# Patient Record
Sex: Female | Born: 2005 | Race: Black or African American | Hispanic: No | Marital: Single | Smoking: Never smoker
Health system: Southern US, Community
[De-identification: ages and names within clinical notes are randomized; demographics above are authoritative.]

## PROBLEM LIST (undated history)

## (undated) DIAGNOSIS — L309 Dermatitis, unspecified: Secondary | ICD-10-CM

## (undated) DIAGNOSIS — J45909 Unspecified asthma, uncomplicated: Secondary | ICD-10-CM

## (undated) DIAGNOSIS — T7840XA Allergy, unspecified, initial encounter: Secondary | ICD-10-CM

## (undated) HISTORY — PX: ADENOIDECTOMY: SUR15

## (undated) HISTORY — PX: APPENDECTOMY: SHX54

## (undated) HISTORY — PX: TONSILLECTOMY: SUR1361

## (undated) HISTORY — DX: Unspecified asthma, uncomplicated: J45.909

## (undated) HISTORY — DX: Allergy, unspecified, initial encounter: T78.40XA

---
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
## 2023-01-05 ENCOUNTER — Other Ambulatory Visit (HOSPITAL_COMMUNITY): Payer: Self-pay

## 2023-01-05 ENCOUNTER — Encounter: Payer: Self-pay | Admitting: Family Medicine

## 2023-01-05 ENCOUNTER — Ambulatory Visit (INDEPENDENT_AMBULATORY_CARE_PROVIDER_SITE_OTHER): Payer: 59 | Admitting: Family Medicine

## 2023-01-05 ENCOUNTER — Other Ambulatory Visit: Payer: Self-pay | Admitting: Family Medicine

## 2023-01-05 VITALS — BP 102/68 | HR 80 | Temp 98.6°F | Ht 64.5 in | Wt 156.6 lb

## 2023-01-05 DIAGNOSIS — E559 Vitamin D deficiency, unspecified: Secondary | ICD-10-CM

## 2023-01-05 DIAGNOSIS — J302 Other seasonal allergic rhinitis: Secondary | ICD-10-CM | POA: Diagnosis not present

## 2023-01-05 DIAGNOSIS — Z3041 Encounter for surveillance of contraceptive pills: Secondary | ICD-10-CM | POA: Diagnosis not present

## 2023-01-05 DIAGNOSIS — Z8709 Personal history of other diseases of the respiratory system: Secondary | ICD-10-CM | POA: Diagnosis not present

## 2023-01-05 DIAGNOSIS — Z8669 Personal history of other diseases of the nervous system and sense organs: Secondary | ICD-10-CM | POA: Diagnosis not present

## 2023-01-05 DIAGNOSIS — Z91018 Allergy to other foods: Secondary | ICD-10-CM

## 2023-01-05 DIAGNOSIS — Z7689 Persons encountering health services in other specified circumstances: Secondary | ICD-10-CM | POA: Diagnosis not present

## 2023-01-05 MED ORDER — LOW-OGESTREL 0.3-30 MG-MCG PO TABS
1.0000 | ORAL_TABLET | Freq: Every day | ORAL | 4 refills | Status: DC
  Filled 2023-01-05: qty 84, 84d supply, fill #0

## 2023-01-05 MED ORDER — EPINEPHRINE 0.3 MG/0.3ML IJ SOAJ
INTRAMUSCULAR | 2 refills | Status: DC
  Filled 2023-01-05: qty 4, 30d supply, fill #0

## 2023-01-05 MED ORDER — CETIRIZINE HCL 10 MG PO TABS
10.0000 mg | ORAL_TABLET | Freq: Every day | ORAL | 3 refills | Status: AC
  Filled 2023-01-05: qty 90, 90d supply, fill #0

## 2023-01-05 NOTE — Patient Instructions (Signed)
If you would like to have allergy testing done let us know so we can place a referral for you to see the allergist.  Typically at least a week or so prior to allergy testing he have to stop taking antihistamines such as Zyrtec.  A prescription was also sent to the pharmacy for a new EpiPen in case you need it for school.  If you do not need it right now you do not have to pick it up.

## 2023-01-05 NOTE — Progress Notes (Signed)
Established Patient Office Visit   Subjective  Patient ID: Heidi Jenkins, female    DOB: 2005/08/04  Age: 17 y.o. MRN: [id number]  Chief Complaint  Patient presents with   Establish Care    Med refills    Patient is an 17 year old female previously seen by Dr. Jeanelle Malling, pediatrics and seen for establish care and follow-up on chronic conditions.  Asthma: No recent flares.  In the past exercise-induced.  Seasonal allergies: Takes cetirizine as needed  Vitamin D deficiency: Taking OTC vitamin D 2000 IU use daily.  Last vitamin D check around January 2024 was right around 30.  History of migraines: No recent headaches.  Was taking a lot of ibuprofen when had frequent headaches.  Advised to stop taking ibuprofen as kidney function was elevated/protein in urine.  Seen by nephrology.  Latest labs normal.  Advised on drinking more water and yearly follow-up as kidneys are good.  Food allergies: Pineapple and bananas causes facial edema/rash in the mouth.  Patient has EpiPen.  Has not had to use it.  Had allergy testing via blood work.  Strep was also positive on testing but patient has been able to eat it without issue.  OCPs: Patient requesting refill on low Ogestren-28.  Not sexually active.  Social history: Patient is currently working at [employer].  Will start school in the fall in [place].  Patient denies alcohol, tobacco, drug use    Past Medical History:  Diagnosis Date   Asthma    Asthma    [employer] 01/18/2020   Eczema    Past Surgical History:  Procedure Laterality Date   ADENOIDECTOMY     APPENDECTOMY N/A    [employer] 01/18/2020   TONSILLECTOMY     Social History   Tobacco Use   Smoking status: Never   Smokeless tobacco: Never  Substance Use Topics   Alcohol use: No   Drug use: No   Family History  Problem Relation Age of Onset   Eczema Sister    Eczema Brother    Asthma Maternal Grandmother    Migraines Father    Seizures Neg Hx     Autism Neg Hx    ADD / ADHD Neg Hx    Anxiety disorder Neg Hx    Depression Neg Hx    Bipolar disorder Neg Hx    Schizophrenia Neg Hx    Allergies  Allergen Reactions   Banana Dermatitis    Flares eczema   Pineapple Dermatitis    Flares eczema      ROS Negative unless stated above    Objective:     BP 102/68 (BP Location: Right Arm, Patient Position: Sitting, Cuff Size: Normal)   Pulse 80   Temp 98.6 F (37 C) (Oral)   Ht 5' 4.5" (1.638 m)   Wt 156 lb 9.6 oz (71 kg)   LMP 12/31/2022 (Approximate)   BMI 26.47 kg/m    Physical Exam Constitutional:      General: She is not in acute distress.    Appearance: Normal appearance.  HENT:     Head: Normocephalic and atraumatic.     Nose: Nose normal.     Mouth/Throat:     Mouth: Mucous membranes are moist.  Neck:     Comments: Mild fullness in the base of anterior neck.  No masses or tenderness appreciated. Cardiovascular:     Rate and Rhythm: Normal rate and regular rhythm.     Heart sounds: Normal heart sounds. No murmur heard.  No gallop.  Pulmonary:     Effort: Pulmonary effort is normal. No respiratory distress.     Breath sounds: Normal breath sounds. No wheezing, rhonchi or rales.  Skin:    General: Skin is warm and dry.  Neurological:     Mental Status: She is alert and oriented to person, place, and time.      No results found for any visits on 01/05/23.    Assessment & Plan:  Seasonal allergies -     Cetirizine HCl; Take 1 tablet (10 mg total) by mouth daily.  Dispense: 90 tablet; Refill: 3  Vitamin D deficiency -Continue OTC vitamin D 2000 IUs daily  History of asthma -Controlled, no recent exacerbations.  History of migraine -No recent headaches.  Encounter for surveillance of contraceptive pills -     Low-Ogestrel; Take 1 tablet by mouth daily.  Dispense: 84 tablet; Refill: 4  Food allergy -Avoidance--pineapple and bananas -Consider having skin test done to assess current  allergies -     EPINEPHrine; INJECT 0.3 ML(1 PEN) INTO THE MUSCLE AS NEEDED FOR ANAPHYLAXIS  Dispense: 4 each; Refill: 2  Encounter to establish care -We reviewed the PMH, PSH, FH, SH, Meds and Allergies. -We provided refills for any medications we will prescribe as needed. -We addressed current concerns per orders and patient instructions. -We have asked for records for pertinent exams, studies, vaccines and notes from previous providers. -We have advised patient to follow up per instructions below.   Return if symptoms worsen or fail to improve.   Deeann Saint, MD

## 2023-01-06 ENCOUNTER — Other Ambulatory Visit (HOSPITAL_COMMUNITY): Payer: Self-pay

## 2023-01-06 ENCOUNTER — Other Ambulatory Visit: Payer: Self-pay

## 2023-01-30 ENCOUNTER — Ambulatory Visit (INDEPENDENT_AMBULATORY_CARE_PROVIDER_SITE_OTHER): Payer: 59 | Admitting: Allergy

## 2023-01-30 ENCOUNTER — Encounter: Payer: Self-pay | Admitting: Allergy

## 2023-01-30 ENCOUNTER — Other Ambulatory Visit (HOSPITAL_COMMUNITY): Payer: Self-pay

## 2023-01-30 VITALS — BP 100/68 | HR 88 | Temp 98.6°F | Ht 64.5 in | Wt 156.9 lb

## 2023-01-30 DIAGNOSIS — L2089 Other atopic dermatitis: Secondary | ICD-10-CM

## 2023-01-30 DIAGNOSIS — J4520 Mild intermittent asthma, uncomplicated: Secondary | ICD-10-CM | POA: Diagnosis not present

## 2023-01-30 DIAGNOSIS — Z91018 Allergy to other foods: Secondary | ICD-10-CM

## 2023-01-30 MED ORDER — FLUTICASONE PROPIONATE 50 MCG/ACT NA SUSP
1.0000 | Freq: Every day | NASAL | 5 refills | Status: AC
  Filled 2023-01-30: qty 16, 30d supply, fill #0

## 2023-01-30 MED ORDER — OLOPATADINE HCL 0.2 % OP SOLN
1.0000 [drp] | Freq: Every day | OPHTHALMIC | 5 refills | Status: AC | PRN
  Filled 2023-01-30: qty 2.5, 50d supply, fill #0

## 2023-01-30 MED ORDER — EPINEPHRINE 0.3 MG/0.3ML IJ SOAJ
INTRAMUSCULAR | 1 refills | Status: AC
  Filled 2023-01-30: qty 4, 7d supply, fill #0

## 2023-01-30 MED ORDER — TRIAMCINOLONE ACETONIDE 0.1 % EX OINT
TOPICAL_OINTMENT | CUTANEOUS | 2 refills | Status: DC
  Filled 2023-01-30 – 2023-02-03 (×2): qty 454, 30d supply, fill #0

## 2023-01-30 MED ORDER — ALBUTEROL SULFATE HFA 108 (90 BASE) MCG/ACT IN AERS
2.0000 | INHALATION_SPRAY | RESPIRATORY_TRACT | 1 refills | Status: AC | PRN
  Filled 2023-01-30: qty 18, 17d supply, fill #0

## 2023-01-30 MED ORDER — LEVOCETIRIZINE DIHYDROCHLORIDE 5 MG PO TABS
5.0000 mg | ORAL_TABLET | Freq: Every evening | ORAL | 5 refills | Status: AC
  Filled 2023-01-30: qty 30, 30d supply, fill #0

## 2023-01-30 MED ORDER — ELIDEL 1 % EX CREA
TOPICAL_CREAM | Freq: Two times a day (BID) | CUTANEOUS | 5 refills | Status: AC | PRN
  Filled 2023-01-30: qty 120, 30d supply, fill #0

## 2023-01-30 NOTE — Progress Notes (Signed)
Follow-up Note  RE: Heidi Jenkins MRN: [id number] DOB: 2006/07/11 Date of Office Visit: 01/30/2023   History of present illness: Heidi Jenkins is a 17 y.o. female presenting today for follow-up of atopic dermatitis, food allergy, allergic rhinitis and asthma. She presents today with her father.  She was last seen in the office on 06/13/21 by myself.  She will be going off to college soon at [employer] planning to major in psychology.  She states she still does have eczema flares on her chest and back still.  She uses triamcinolone about once a month at this time and it does still help with flares.  She has not much on her face but states over the winter would have flares on her face.  She has used elidel for face but does not have any currently to use.  She moisturizes after bathing.   She is still avoiding pineapple and banana in the diet.  She states she has been having issues with cherries now causing throat to itch however is not necessarily avoiding it.   She has an epipen that was filled last month.  With her allergies she reports sneezing mainly.  She is taking cetirizine daily and helps little bit.  She has not needed to use any nasal sprays or eye drops lately.   She states her asthma has been doing well.  She states she has had any any albuterol needs since the last visit. She has not had any ED/UC visits or systemic steroid needs since last visit.   Review of systems: 10 pt ROS of systems negative unless  Past medical/social/surgical/family history have been reviewed and are unchanged unless specifically indicated below.  No changes  Medication List: Current Outpatient Medications  Medication Sig Dispense Refill   Biotin 5 MG TABS Take by mouth.     cetirizine (ZYRTEC) 10 MG tablet Take 1 tablet (10 mg) by mouth daily. 90 tablet 3   Ergocalciferol 50 MCG (2000 UT) TABS Take by mouth.     levocetirizine (XYZAL) 5 MG tablet Take 1 tablet (5 mg total) by mouth every  evening. 30 tablet 5   LOW-OGESTREL 0.3-30 MG-MCG tablet Take 1 tablet by mouth daily. 84 tablet 4   Melatonin 10 MG TABS Take by mouth.     Olopatadine HCl 0.2 % SOLN Apply 1 drop to eye daily as needed (itchy/watery eyes). 2.5 mL 5   Pediatric Multivitamins-Iron (ANIMAL SHAPES/IRON) 18 MG CHEW Chew 1 tablet by mouth daily.     albuterol (PROAIR HFA) 108 (90 Base) MCG/ACT inhaler Inhale 2 puffs into the lungs every 4 (four) hours as needed for wheezing or shortness of breath. 18 g 1   ELIDEL 1 % cream Apply topically 2 (two) times daily as needed (eczema flare). Can use on face 100 g 5   EPINEPHrine 0.3 mg/0.3 mL IJ SOAJ injection INJECT 0.3 ML(1 PEN) INTO THE OUTER THIGH AS NEEDED FOR ANAPHYLAXIS 4 each 1   fluticasone (FLONASE) 50 MCG/ACT nasal spray 1-2 sprays in each nostril once a day as needed for runny nose. 16 g 5   triamcinolone ointment (KENALOG) 0.1 % Apply to red, itchy areas below your face twice a day as needed. 453.6 g 2   No current facility-administered medications for this visit.     Known medication allergies: Allergies  Allergen Reactions   Banana Dermatitis    Flares eczema. Facial edema   Pineapple Dermatitis    Flares eczema. Rash in mouth  Physical examination: Blood pressure 100/68, pulse 88, temperature 98.6 F (37 C), temperature source Temporal, height 5' 4.5" (1.638 m), weight 156 lb 14.4 oz (71.2 kg), last menstrual period 12/31/2022, SpO2 99%.  General: Alert, interactive, in no acute distress. HEENT: PERRLA, TMs pearly gray, turbinates minimally edematous without discharge, post-pharynx non erythematous. Neck: Supple without lymphadenopathy. Lungs: Clear to auscultation without wheezing, rhonchi or rales. {no increased work of breathing. CV: Normal S1, S2 without murmurs. Abdomen: Nondistended, nontender. Skin: Warm and dry, without lesions or rashes. Extremities:  No clubbing, cyanosis or edema. Neuro:   Grossly  intact.  Diagnositics/Labs:  Spirometry: FEV1: 2.83L 96%, FVC: 3.57L 107%, ratio consistent with nonobstructive pattern  Assessment and plan: Atopic dermatitis  - Bathe and soak for 5-10 minutes in warm water once a day. Pat dry.  Immediately apply the below cream prescribed to flared areas (red, irritated, dry, itchy, patchy, scaly, flaky) only. Wait several minutes and then apply your moisturizer all over.    To affected areas on the face and neck, apply: Elidel 1% ointment twice a day as needed. Be careful to avoid the eyes. To affected areas on the body (below the face and neck), apply: Triamcinolone 0.1 % ointment twice a day as needed. With ointments be careful to avoid the armpits and groin area. - Make a note of any foods that make eczema worse. - Keep finger nails trimmed.  Food allergy    - continue avoidance of banana and pineapple.   Would also avoid cherries to prevent throat itching (this is likely related to your pollen allergen)    - have access to self-injectable epinephrine (Epipen or AuviQ) 0.3mg  at all times    - follow emergency action plan in case of allergic reaction  Allergic rhinitis      - continue allergen avoidance measures for grasses, trees, weeds, molds, cat, dog, dust mites      - change zyrtec to Xyzal 5mg  daily as needed.       - can use Flonase 1-2 sprays each nostril as needed for nasal congestion      - can use of Olopatadine 0.2% 1 drop each eye daily as needed use for itchy, watery, red eyes  Asthma   - well controlled.  Lung function looks great today   - have access to albuterol inhaler 2 puffs every 4-6 hours as needed for cough/wheeze/shortness of breath/chest tightness.  May use 15-20 minutes prior to activity.   Monitor frequency of use.    Asthma control goals:  Full participation in all desired activities (may need albuterol before activity) Albuterol use two time or less a week on average (not counting use with activity) Cough  interfering with sleep two time or less a month Oral steroids no more than once a year No hospitalizations  Follow-up 12 months or sooner if needed  I appreciate the opportunity to take part in Jaylyn's care. Please do not hesitate to contact me with questions.  Sincerely,   Margo Aye, MD Allergy/Immunology [facility]

## 2023-01-30 NOTE — Patient Instructions (Addendum)
Atopic dermatitis  - Bathe and soak for 5-10 minutes in warm water once a day. Pat dry.  Immediately apply the below cream prescribed to flared areas (red, irritated, dry, itchy, patchy, scaly, flaky) only. Wait several minutes and then apply your moisturizer all over.    To affected areas on the face and neck, apply: Elidel 1% ointment twice a day as needed. Be careful to avoid the eyes. To affected areas on the body (below the face and neck), apply: Triamcinolone 0.1 % ointment twice a day as needed. With ointments be careful to avoid the armpits and groin area. - Make a note of any foods that make eczema worse. - Keep finger nails trimmed.  Food allergy    - continue avoidance of banana and pineapple.   Would also avoid cherries to prevent throat itching (this is likely related to your pollen allergen)    - have access to self-injectable epinephrine (Epipen or AuviQ) 0.3mg  at all times    - follow emergency action plan in case of allergic reaction  Allergic rhinitis      - continue allergen avoidance measures for grasses, trees, weeds, molds, cat, dog, dust mites      - change zyrtec to Xyzal 5mg  daily as needed.       - can use Flonase 1-2 sprays each nostril as needed for nasal congestion      - can use of Olopatadine 0.2% 1 drop each eye daily as needed use for itchy, watery, red eyes  Asthma   - well controlled.  Lung function looks great today   - have access to albuterol inhaler 2 puffs every 4-6 hours as needed for cough/wheeze/shortness of breath/chest tightness.  May use 15-20 minutes prior to activity.   Monitor frequency of use.    Asthma control goals:  Full participation in all desired activities (may need albuterol before activity) Albuterol use two time or less a week on average (not counting use with activity) Cough interfering with sleep two time or less a month Oral steroids no more than once a year No hospitalizations  Follow-up 12 months or sooner if needed

## 2023-01-31 ENCOUNTER — Other Ambulatory Visit (HOSPITAL_COMMUNITY): Payer: Self-pay

## 2023-02-02 ENCOUNTER — Telehealth: Payer: Self-pay | Admitting: Family Medicine

## 2023-02-02 ENCOUNTER — Other Ambulatory Visit (HOSPITAL_COMMUNITY): Payer: Self-pay

## 2023-02-02 DIAGNOSIS — Z3041 Encounter for surveillance of contraceptive pills: Secondary | ICD-10-CM

## 2023-02-02 MED ORDER — LOW-OGESTREL 0.3-30 MG-MCG PO TABS
1.0000 | ORAL_TABLET | Freq: Every day | ORAL | 4 refills | Status: DC
Start: 2023-02-02 — End: 2024-02-14
  Filled 2023-02-02 – 2023-04-14 (×2): qty 84, 84d supply, fill #0
  Filled 2023-06-19: qty 84, 84d supply, fill #1
  Filled 2023-12-03: qty 56, 56d supply, fill #1

## 2023-02-02 NOTE — Telephone Encounter (Signed)
Refills were sent to pharmacy on 01/05/23, but did not see pharmacy confirmation, resent refills to requested pharmacy.

## 2023-02-02 NOTE — Telephone Encounter (Signed)
Prescription Request  02/02/2023  LOV: 01/05/2023  What is the name of the medication or equipment? Birth Control Pills (but mom was not sure of the name)  Have you contacted your pharmacy to request a refill? Yes   Which pharmacy would you like this sent to?   [place] [facility] [address] [place] [address] Phone: [phone] Fax: [phone]  Patient notified that their request is being sent to the clinical staff for review and that they should receive a response within 2 business days.   Please advise at Mobile [phone] (mobile)

## 2023-02-03 ENCOUNTER — Other Ambulatory Visit: Payer: Self-pay

## 2023-02-03 ENCOUNTER — Other Ambulatory Visit (HOSPITAL_COMMUNITY): Payer: Self-pay

## 2023-02-04 ENCOUNTER — Other Ambulatory Visit (HOSPITAL_COMMUNITY): Payer: Self-pay

## 2023-02-10 ENCOUNTER — Other Ambulatory Visit: Payer: Self-pay

## 2023-02-11 ENCOUNTER — Other Ambulatory Visit (HOSPITAL_COMMUNITY): Payer: Self-pay

## 2023-02-18 ENCOUNTER — Other Ambulatory Visit (HOSPITAL_COMMUNITY): Payer: Self-pay

## 2023-04-14 ENCOUNTER — Other Ambulatory Visit (HOSPITAL_COMMUNITY): Payer: Self-pay

## 2023-04-15 ENCOUNTER — Other Ambulatory Visit (HOSPITAL_COMMUNITY): Payer: Self-pay

## 2023-06-19 ENCOUNTER — Other Ambulatory Visit: Payer: Self-pay

## 2023-06-19 ENCOUNTER — Other Ambulatory Visit (HOSPITAL_COMMUNITY): Payer: Self-pay

## 2023-06-19 MED ORDER — NORGESTREL-ETHINYL ESTRADIOL 0.3-30 MG-MCG PO TABS
1.0000 | ORAL_TABLET | Freq: Every day | ORAL | 2 refills | Status: AC
  Filled 2023-06-19: qty 84, 84d supply, fill #0
  Filled 2023-09-14: qty 84, 84d supply, fill #1

## 2023-06-22 ENCOUNTER — Other Ambulatory Visit (HOSPITAL_COMMUNITY): Payer: Self-pay

## 2023-06-24 ENCOUNTER — Other Ambulatory Visit (HOSPITAL_COMMUNITY): Payer: Self-pay

## 2023-06-25 ENCOUNTER — Other Ambulatory Visit (HOSPITAL_COMMUNITY): Payer: Self-pay

## 2023-07-02 ENCOUNTER — Ambulatory Visit (INDEPENDENT_AMBULATORY_CARE_PROVIDER_SITE_OTHER): Payer: 59

## 2023-07-02 ENCOUNTER — Ambulatory Visit: Payer: 59

## 2023-07-02 DIAGNOSIS — Z23 Encounter for immunization: Secondary | ICD-10-CM

## 2023-09-14 ENCOUNTER — Other Ambulatory Visit (HOSPITAL_COMMUNITY): Payer: Self-pay

## 2023-09-15 ENCOUNTER — Other Ambulatory Visit (HOSPITAL_COMMUNITY): Payer: Self-pay

## 2023-09-15 DIAGNOSIS — R809 Proteinuria, unspecified: Secondary | ICD-10-CM | POA: Diagnosis not present

## 2023-09-15 DIAGNOSIS — G43909 Migraine, unspecified, not intractable, without status migrainosus: Secondary | ICD-10-CM | POA: Diagnosis not present

## 2023-09-15 DIAGNOSIS — R7989 Other specified abnormal findings of blood chemistry: Secondary | ICD-10-CM | POA: Diagnosis not present

## 2023-09-16 ENCOUNTER — Other Ambulatory Visit (HOSPITAL_COMMUNITY): Payer: Self-pay

## 2023-09-16 ENCOUNTER — Ambulatory Visit (INDEPENDENT_AMBULATORY_CARE_PROVIDER_SITE_OTHER): Payer: 59 | Admitting: Family Medicine

## 2023-09-16 VITALS — BP 98/66 | HR 79 | Temp 99.0°F | Ht 64.54 in | Wt 174.0 lb

## 2023-09-16 DIAGNOSIS — L308 Other specified dermatitis: Secondary | ICD-10-CM | POA: Diagnosis not present

## 2023-09-16 DIAGNOSIS — Z1159 Encounter for screening for other viral diseases: Secondary | ICD-10-CM

## 2023-09-16 DIAGNOSIS — Z8669 Personal history of other diseases of the nervous system and sense organs: Secondary | ICD-10-CM

## 2023-09-16 DIAGNOSIS — Z113 Encounter for screening for infections with a predominantly sexual mode of transmission: Secondary | ICD-10-CM

## 2023-09-16 DIAGNOSIS — Z0000 Encounter for general adult medical examination without abnormal findings: Secondary | ICD-10-CM | POA: Diagnosis not present

## 2023-09-16 DIAGNOSIS — E559 Vitamin D deficiency, unspecified: Secondary | ICD-10-CM

## 2023-09-16 LAB — CBC WITH DIFFERENTIAL/PLATELET
Basophils Absolute: 0 10*3/uL (ref 0.0–0.1)
Basophils Relative: 0.8 % (ref 0.0–3.0)
Eosinophils Absolute: 0.2 10*3/uL (ref 0.0–0.7)
Eosinophils Relative: 3.7 % (ref 0.0–5.0)
HCT: 41 % (ref 36.0–49.0)
Hemoglobin: 13.4 g/dL (ref 12.0–16.0)
Lymphocytes Relative: 34.5 % (ref 24.0–48.0)
Lymphs Abs: 1.9 10*3/uL (ref 0.7–4.0)
MCHC: 32.7 g/dL (ref 31.0–37.0)
MCV: 87.6 fl (ref 78.0–98.0)
Monocytes Absolute: 0.3 10*3/uL (ref 0.1–1.0)
Monocytes Relative: 6.2 % (ref 3.0–12.0)
Neutro Abs: 3 10*3/uL (ref 1.4–7.7)
Neutrophils Relative %: 54.8 % (ref 43.0–71.0)
Platelets: 276 10*3/uL (ref 150.0–575.0)
RBC: 4.68 Mil/uL (ref 3.80–5.70)
RDW: 14.4 % (ref 11.4–15.5)
WBC: 5.6 10*3/uL (ref 4.5–13.5)

## 2023-09-16 LAB — COMPREHENSIVE METABOLIC PANEL
ALT: 30 U/L (ref 0–35)
AST: 36 U/L (ref 0–37)
Albumin: 4.4 g/dL (ref 3.5–5.2)
Alkaline Phosphatase: 54 U/L (ref 47–119)
BUN: 13 mg/dL (ref 6–23)
CO2: 27 meq/L (ref 19–32)
Calcium: 9.6 mg/dL (ref 8.4–10.5)
Chloride: 103 meq/L (ref 96–112)
Creatinine, Ser: 1.01 mg/dL (ref 0.40–1.20)
GFR: 80.81 mL/min (ref >60.00)
Glucose, Bld: 91 mg/dL (ref 70–99)
Potassium: 4.3 meq/L (ref 3.5–5.1)
Sodium: 136 meq/L (ref 135–145)
Total Bilirubin: 0.4 mg/dL (ref 0.2–1.2)
Total Protein: 7.2 g/dL (ref 6.0–8.3)

## 2023-09-16 LAB — HEMOGLOBIN A1C: Hgb A1c MFr Bld: 5.3 % (ref 4.6–6.5)

## 2023-09-16 LAB — TSH: TSH: 3.3 u[IU]/mL (ref 0.40–5.00)

## 2023-09-16 LAB — T4, FREE: Free T4: 0.83 ng/dL (ref 0.60–1.60)

## 2023-09-16 LAB — VITAMIN D 25 HYDROXY (VIT D DEFICIENCY, FRACTURES): VITD: 55.7 ng/mL (ref 30.00–100.00)

## 2023-09-16 MED ORDER — TRIAMCINOLONE ACETONIDE 0.1 % EX OINT
1.0000 | TOPICAL_OINTMENT | Freq: Two times a day (BID) | CUTANEOUS | 0 refills | Status: AC
  Filled 2023-09-16: qty 80, 30d supply, fill #0

## 2023-09-16 NOTE — Progress Notes (Signed)
 Established Patient Office Visit   Subjective  Patient ID: Heidi Jenkins, female    DOB: 2005-08-26  Age: 18 y.o. MRN: [id number]  Chief Complaint  Patient presents with   Annual Exam    Patient is a 18 year old female seen for CPE.  Patient states she has been doing well overall.  Not fasting this morning as 8 yogurt, granola bar, and an orange.  Patient states she is having a flare of eczema on her abdomen.  Was using Elidel cream but does not feel like it is working as well as it used to.  Patient denies changes in soaps, lotions, detergents.  Typically takes hot showers.  Drinking 32 ounces of water daily.    Patient Active Problem List   Diagnosis Date Noted   Migraine without aura and without status migrainosus, not intractable 01/20/2020   Tension headache 01/20/2020   Past Medical History:  Diagnosis Date   Allergy    Asthma    Asthma    [employer] 01/18/2020   Eczema    Past Surgical History:  Procedure Laterality Date   ADENOIDECTOMY     APPENDECTOMY N/A    [employer] 01/18/2020   TONSILLECTOMY     Social History   Tobacco Use   Smoking status: Never   Smokeless tobacco: Never  Substance Use Topics   Alcohol use: No   Drug use: No   Family History  Problem Relation Age of Onset   Hypertension Mother    Migraines Father    Eczema Sister    Eczema Brother    Stroke Maternal Grandmother    Hypertension Maternal Grandmother    Asthma Maternal Grandmother    High Cholesterol Maternal Grandmother    Seizures Neg Hx    Autism Neg Hx    ADD / ADHD Neg Hx    Anxiety disorder Neg Hx    Depression Neg Hx    Bipolar disorder Neg Hx    Schizophrenia Neg Hx    Allergies  Allergen Reactions   Pineapple Dermatitis and Swelling    Flares eczema.  Rash in mouth   Banana Dermatitis and Swelling    Flares eczema.  Facial edema      ROS Negative unless stated above    Objective:     BP 98/66 (BP Location: Left Arm, Patient Position: Sitting,  Cuff Size: Normal)   Pulse 79   Temp 99 F (37.2 C) (Oral)   Ht 5' 4.54" (1.639 m)   Wt 174 lb (78.9 kg)   LMP 09/10/2023 (Exact Date)   SpO2 97%   BMI 29.37 kg/m  BP Readings from Last 3 Encounters:  09/16/23 98/66  01/30/23 100/68  01/05/23 102/68   Wt Readings from Last 3 Encounters:  09/16/23 174 lb (78.9 kg) (93%, Z= 1.50)*  01/30/23 156 lb 14.4 oz (71.2 kg) (87%, Z= 1.14)*  01/05/23 156 lb 9.6 oz (71 kg) (87%, Z= 1.14)*   * Growth percentiles are based on CDC (Girls, 2-20 Years) data.      Physical Exam Constitutional:      Appearance: Normal appearance.  HENT:     Head: Normocephalic and atraumatic.     Right Ear: Tympanic membrane, ear canal and external ear normal.     Left Ear: Tympanic membrane, ear canal and external ear normal.     Nose: Nose normal.     Mouth/Throat:     Mouth: Mucous membranes are moist.     Pharynx: No oropharyngeal exudate or posterior oropharyngeal  erythema.  Eyes:     General: No scleral icterus.    Extraocular Movements: Extraocular movements intact.     Conjunctiva/sclera: Conjunctivae normal.     Pupils: Pupils are equal, round, and reactive to light.  Neck:     Thyroid: No thyromegaly.  Cardiovascular:     Rate and Rhythm: Normal rate and regular rhythm.     Pulses: Normal pulses.     Heart sounds: Normal heart sounds. No murmur heard.    No friction rub.  Pulmonary:     Effort: Pulmonary effort is normal.     Breath sounds: Normal breath sounds. No wheezing, rhonchi or rales.  Abdominal:     General: Bowel sounds are normal.     Palpations: Abdomen is soft.     Tenderness: There is no abdominal tenderness.  Musculoskeletal:        General: No deformity. Normal range of motion.  Lymphadenopathy:     Cervical: No cervical adenopathy.  Skin:    General: Skin is warm and dry.     Findings: No lesion.  Neurological:     General: No focal deficit present.     Mental Status: She is alert and oriented to person, place,  and time.  Psychiatric:        Mood and Affect: Mood normal.        Thought Content: Thought content normal.      No results found for any visits on 09/16/23.    Assessment & Plan:  Well adult exam -Age-appropriate health screenings discussed -Obtain labs.  Patient is not fasting. -Pap not yet indicated 2/2 age. -Immunizations reviewed -Next CPE in 1 year -     CBC with Differential/Platelet; Future -     Comprehensive metabolic panel; Future -     TSH -     T4, free -     Hemoglobin A1c; Future  Vitamin D deficiency -     VITAMIN D 25 Hydroxy (Vit-D Deficiency, Fractures); Future  Routine screening for STI (sexually transmitted infection) -     HIV Antibody (routine testing w rflx) -     RPR -     C. trachomatis/N. gonorrhoeae RNA  Encounter for hepatitis C screening test for low risk patient -     Hepatitis C antibody  Other eczema -Continue follow-up with asthma/allergy -Avoid eczema triggers.  Encouraged to increase intake of water and fluids, take warm not hot showers. -     Triamcinolone Acetonide; Apply 1 Application topically 2 (two) times daily.  Dispense: 80 g; Refill: 0    Return if symptoms worsen or fail to improve.   Deeann Saint, MD

## 2023-09-17 LAB — HIV ANTIBODY (ROUTINE TESTING W REFLEX): HIV 1&2 Ab, 4th Generation: NONREACTIVE

## 2023-09-17 LAB — C. TRACHOMATIS/N. GONORRHOEAE RNA
C. trachomatis RNA, TMA: NOT DETECTED
N. gonorrhoeae RNA, TMA: NOT DETECTED

## 2023-09-17 LAB — RPR: RPR Ser Ql: NONREACTIVE

## 2023-09-17 LAB — HEPATITIS C ANTIBODY: Hepatitis C Ab: NONREACTIVE

## 2023-09-20 ENCOUNTER — Encounter: Payer: Self-pay | Admitting: Family Medicine

## 2023-12-03 ENCOUNTER — Other Ambulatory Visit (HOSPITAL_COMMUNITY): Payer: Self-pay

## 2023-12-03 ENCOUNTER — Other Ambulatory Visit: Payer: Self-pay

## 2023-12-04 ENCOUNTER — Other Ambulatory Visit (HOSPITAL_COMMUNITY): Payer: Self-pay

## 2024-02-03 ENCOUNTER — Other Ambulatory Visit (HOSPITAL_COMMUNITY): Payer: Self-pay

## 2024-02-14 ENCOUNTER — Other Ambulatory Visit: Payer: Self-pay | Admitting: Family Medicine

## 2024-02-14 DIAGNOSIS — Z3041 Encounter for surveillance of contraceptive pills: Secondary | ICD-10-CM

## 2024-02-15 ENCOUNTER — Other Ambulatory Visit (HOSPITAL_BASED_OUTPATIENT_CLINIC_OR_DEPARTMENT_OTHER): Payer: Self-pay

## 2024-02-15 ENCOUNTER — Other Ambulatory Visit (HOSPITAL_COMMUNITY): Payer: Self-pay

## 2024-02-15 MED ORDER — LOW-OGESTREL 0.3-30 MG-MCG PO TABS
1.0000 | ORAL_TABLET | Freq: Every day | ORAL | 4 refills | Status: AC
  Filled 2024-02-15: qty 84, 84d supply, fill #0
  Filled 2024-04-13 – 2024-04-21 (×2): qty 84, 84d supply, fill #1
  Filled 2024-07-13: qty 84, 84d supply, fill #2

## 2024-02-16 ENCOUNTER — Other Ambulatory Visit: Payer: Self-pay

## 2024-02-17 ENCOUNTER — Telehealth: Payer: Self-pay

## 2024-02-17 ENCOUNTER — Other Ambulatory Visit (HOSPITAL_COMMUNITY): Payer: Self-pay

## 2024-02-17 ENCOUNTER — Other Ambulatory Visit: Payer: Self-pay

## 2024-02-17 NOTE — Telephone Encounter (Signed)
 Copied from CRM #[id number]. Topic: Clinical - Medication Question >> Feb 17, 2024  2:37 PM Robinson H wrote: Reason for CRM: Powell Kotyk [facility] sent secure chat to provider regarding patients LOW-OGESTREL  0.3-30 MG-MCG tablet, states written as name brand and patient usually gets generic filled and name brand will cost patient more  Powell Kotyk [employer] [phone]

## 2024-02-17 NOTE — Telephone Encounter (Signed)
 Called and spoke with pharmacy, VO given for generic Birth control

## 2024-02-18 ENCOUNTER — Other Ambulatory Visit (HOSPITAL_BASED_OUTPATIENT_CLINIC_OR_DEPARTMENT_OTHER): Payer: Self-pay

## 2024-02-18 ENCOUNTER — Other Ambulatory Visit: Payer: Self-pay

## 2024-02-20 ENCOUNTER — Other Ambulatory Visit (HOSPITAL_COMMUNITY): Payer: Self-pay

## 2024-04-14 ENCOUNTER — Other Ambulatory Visit (HOSPITAL_COMMUNITY): Payer: Self-pay

## 2024-04-22 ENCOUNTER — Other Ambulatory Visit: Payer: Self-pay

## 2024-09-19 ENCOUNTER — Encounter: Admitting: Family Medicine
